# Patient Record
Sex: Male | Born: 1975 | Race: White | Hispanic: No | Marital: Married | Smoking: Never smoker
Health system: Southern US, Community
[De-identification: ages and names within clinical notes are randomized; demographics above are authoritative.]

## PROBLEM LIST (undated history)

## (undated) DIAGNOSIS — K08409 Partial loss of teeth, unspecified cause, unspecified class: Secondary | ICD-10-CM

---

## 1998-12-02 ENCOUNTER — Emergency Department (HOSPITAL_COMMUNITY): Admission: EM | Admit: 1998-12-02 | Discharge: 1998-12-02 | Payer: Self-pay

## 2017-09-21 ENCOUNTER — Other Ambulatory Visit: Payer: Self-pay | Admitting: Family Medicine

## 2017-09-22 ENCOUNTER — Other Ambulatory Visit: Payer: Self-pay | Admitting: Family Medicine

## 2017-09-22 DIAGNOSIS — N50812 Left testicular pain: Secondary | ICD-10-CM

## 2017-09-22 DIAGNOSIS — K4090 Unilateral inguinal hernia, without obstruction or gangrene, not specified as recurrent: Secondary | ICD-10-CM

## 2017-09-25 ENCOUNTER — Other Ambulatory Visit: Payer: Self-pay

## 2017-09-25 ENCOUNTER — Inpatient Hospital Stay
Admission: RE | Admit: 2017-09-25 | Discharge: 2017-09-25 | Disposition: A | Discharge status: Home / Self Care | Payer: Self-pay | Source: Ambulatory Visit | Attending: Family Medicine | Admitting: Family Medicine

## 2017-09-28 ENCOUNTER — Ambulatory Visit
Admission: RE | Admit: 2017-09-28 | Discharge: 2017-09-28 | Disposition: A | Discharge status: Home / Self Care | Payer: 59 | Source: Ambulatory Visit | Attending: Family Medicine | Admitting: Family Medicine

## 2017-09-28 DIAGNOSIS — K4090 Unilateral inguinal hernia, without obstruction or gangrene, not specified as recurrent: Secondary | ICD-10-CM

## 2017-09-28 DIAGNOSIS — N50812 Left testicular pain: Secondary | ICD-10-CM

## 2019-07-07 ENCOUNTER — Ambulatory Visit: Payer: Self-pay | Attending: Internal Medicine

## 2019-07-07 ENCOUNTER — Ambulatory Visit: Payer: Self-pay

## 2019-07-07 DIAGNOSIS — Z23 Encounter for immunization: Secondary | ICD-10-CM | POA: Insufficient documentation

## 2019-07-07 NOTE — Progress Notes (Signed)
   Covid-19 Vaccination Clinic  Name:  MATTI MINNEY    MRN: [id number] DOB: Mar 17, 1976  07/07/2019  Mr. Klingerman was observed post Covid-19 immunization for 15 minutes without incidence. He was provided with Vaccine Information Sheet and instruction to access the V-Safe system.   Mr. Dains was instructed to call 911 with any severe reactions post vaccine: Marland Kitchen Difficulty breathing  . Swelling of your face and throat  . A fast heartbeat  . A bad rash all over your body  . Dizziness and weakness    Immunizations Administered    Name Date Dose VIS Date Route   Pfizer COVID-19 Vaccine 07/07/2019  5:23 PM 0.3 mL 05/06/2019 Intramuscular   Manufacturer: [employer], [employer]   Lot: [id number]   NDC: [address]-7

## 2019-07-27 IMAGING — US US PELVIS LIMITED
1 series · 7 of 7 positions shown · non-contrast
Comparison: Scrotal ultrasound today reported separately.

CLINICAL DATA: 42-year-old male with left suprapubic fullness and
pulling sensation with tenderness intermittently for 2 months.

EXAM:
LIMITED ULTRASOUND OF PELVIS
TECHNIQUE: Limited ultrasound examination was performed in the area of clinical
concern.

[Series 1: us pelvis limited · 0.06mm/px · 7 acquisitions, 7 frames shown]
[im 1/7]
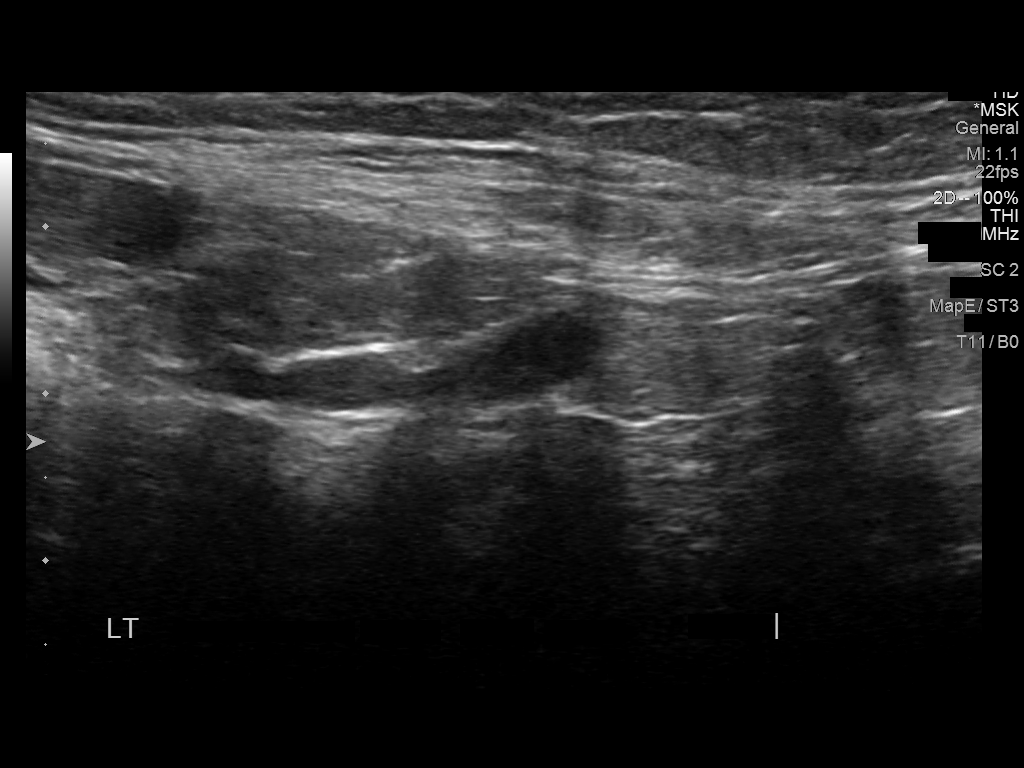
[im 2/7]
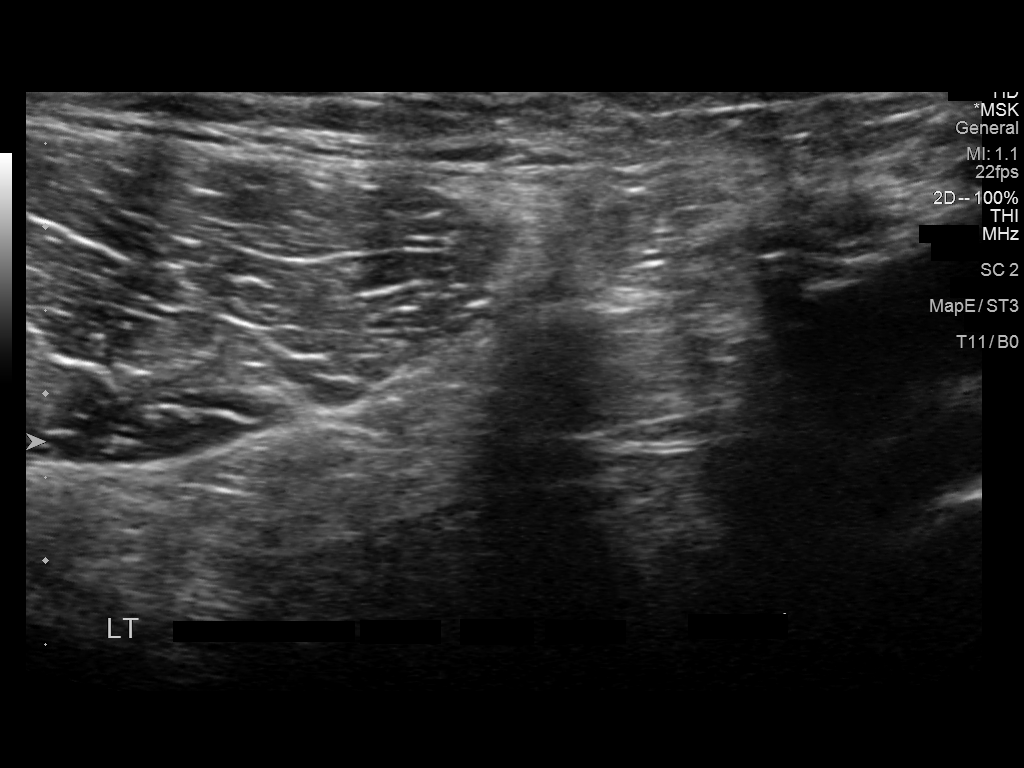
[im 3/7]
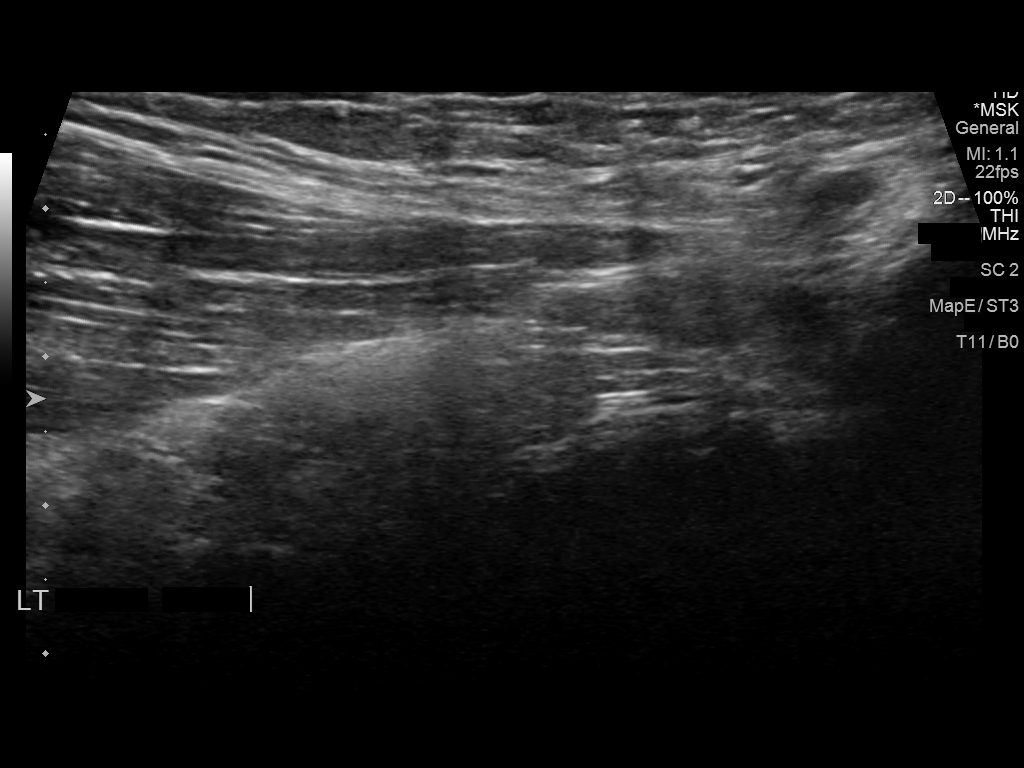
[im 4/7]
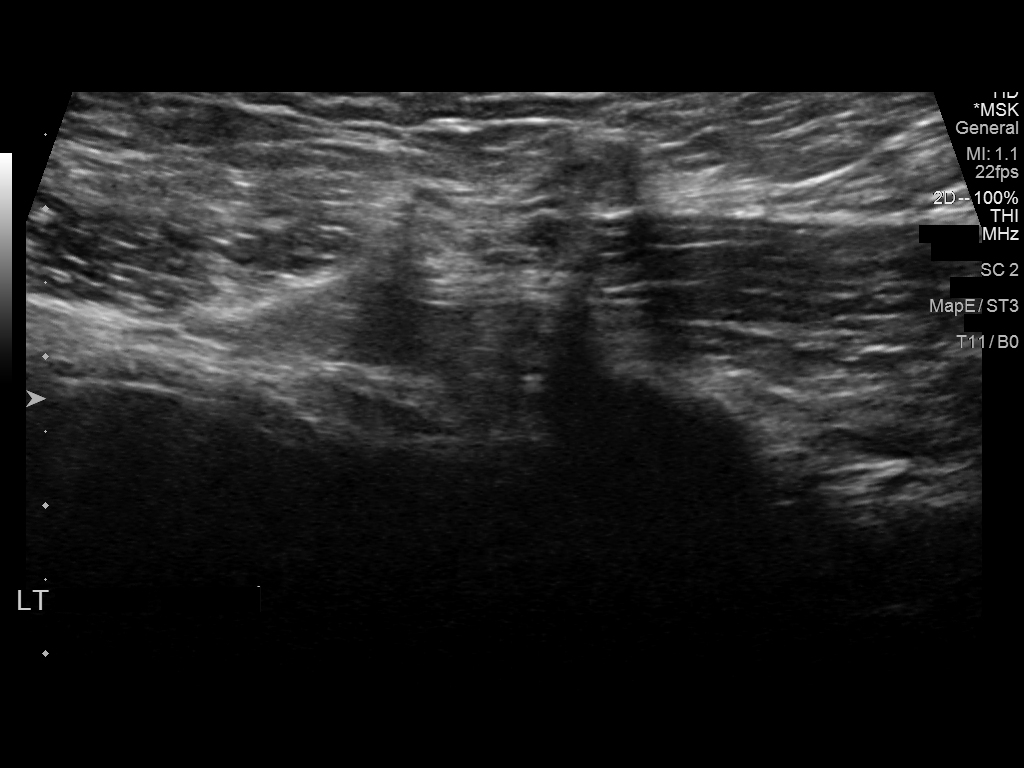
[im 5/7]
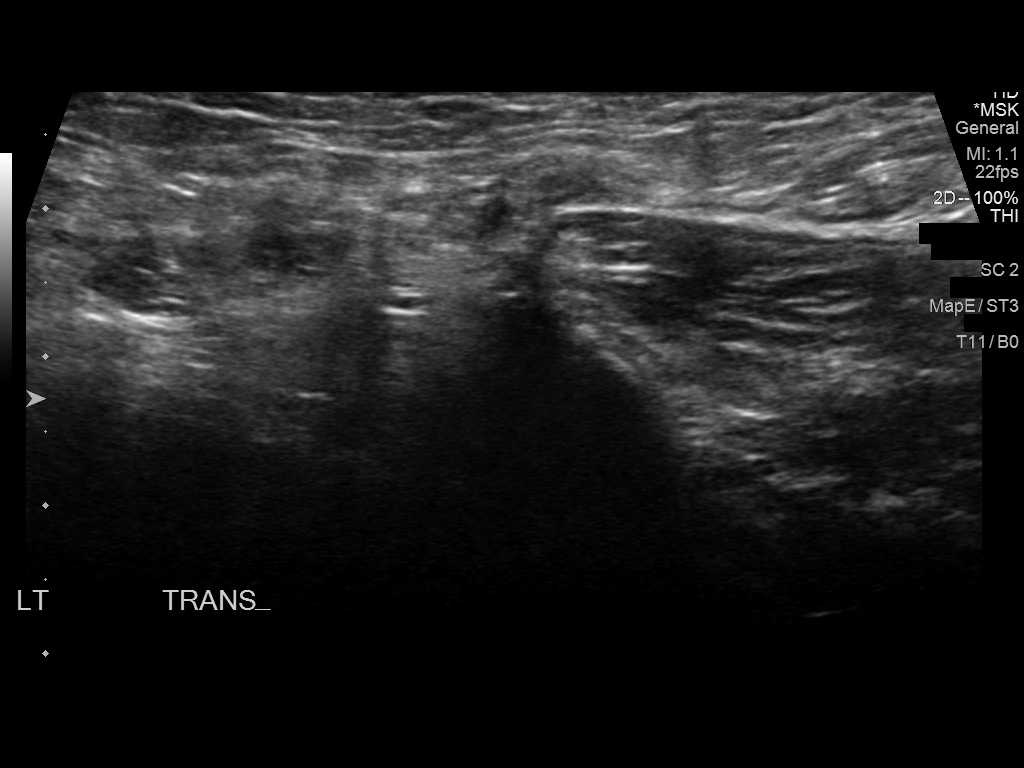
[im 6/7]
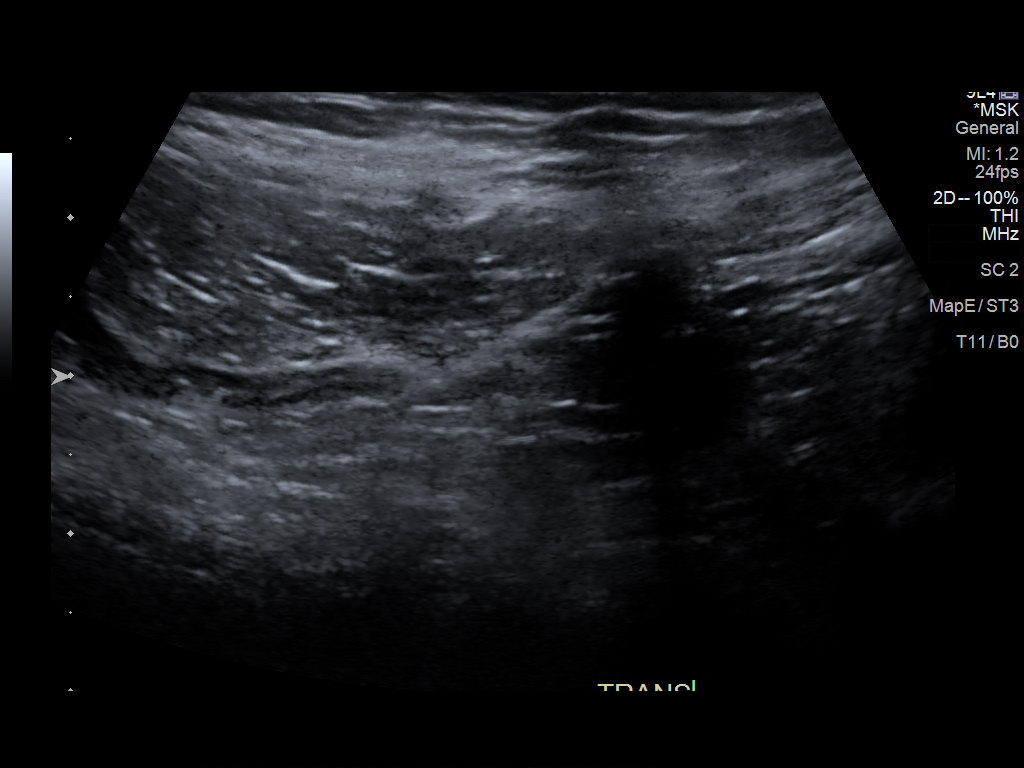
[im 7/7]
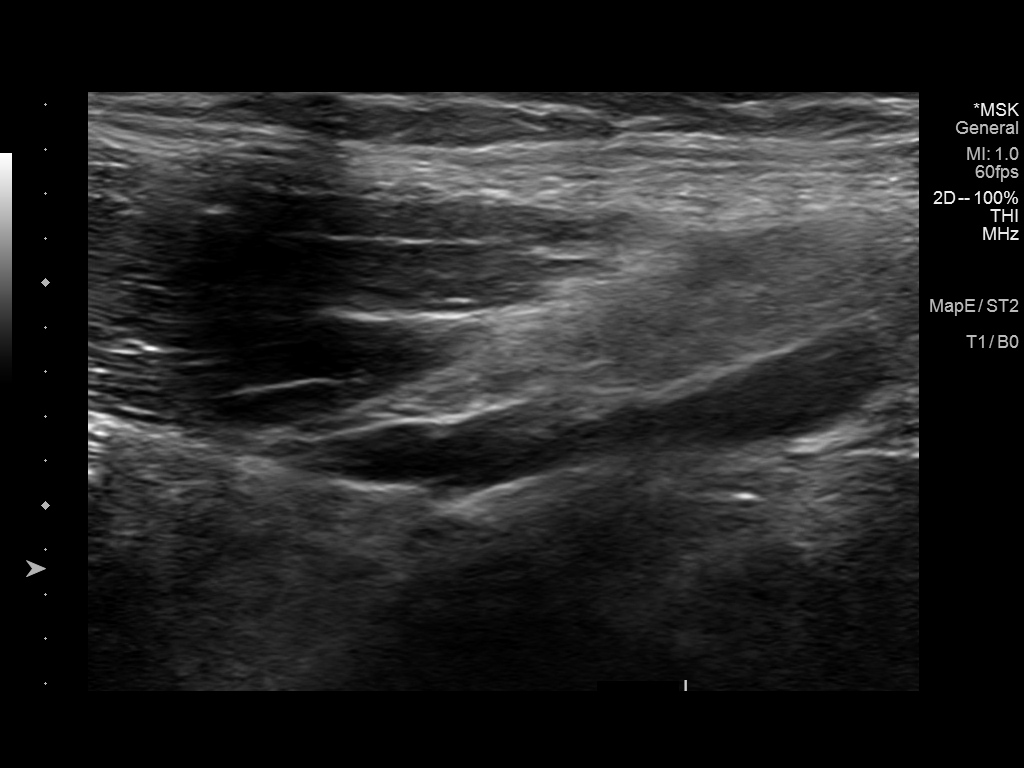

[7 of 7 positions shown; findings below may reference images not displayed]

FINDINGS: Grayscale imaging in the area of clinical concern demonstrates
normal appearing fibromuscular architecture in both resting and
Valsalva states. No hernia, fluid collection, or ultrasound
abnormality is identified.
IMPRESSION: No sonographic abnormality in the area of clinical concern.

If there is strong persistent suspicion of inguinal hernia then
Pelvis CT would be most sensitive.

## 2019-07-31 ENCOUNTER — Ambulatory Visit: Payer: 59 | Attending: Internal Medicine

## 2019-07-31 DIAGNOSIS — Z23 Encounter for immunization: Secondary | ICD-10-CM

## 2019-07-31 NOTE — Progress Notes (Signed)
   Covid-19 Vaccination Clinic  Name:  Bradley Hammond    MRN: [id number] DOB: 10/20/1975  07/31/2019  Mr. [name] was observed post Covid-19 immunization for 15 minutes without incident. He was provided with Vaccine Information Sheet and instruction to access the V-Safe system.   Mr. [name] was instructed to call 911 with any severe reactions post vaccine: Marland Kitchen Difficulty breathing  . Swelling of face and throat  . A fast heartbeat  . A bad rash all over body  . Dizziness and weakness   Immunizations Administered    Name Date Dose VIS Date Route   Pfizer COVID-19 Vaccine 07/31/2019 12:03 PM 0.3 mL 05/06/2019 Intramuscular   Manufacturer: [employer], [employer]   Lot: [id number]   NDC: [address]-6

## 2019-08-15 IMAGING — US US SCROTUM W/ DOPPLER COMPLETE
1 series · 13 of 25 positions shown · non-contrast
Comparison: Inguinal/limited pelvis ultrasound today reported
separately.

CLINICAL DATA: 42-year-old male with left suprapubic fullness and
pulling sensation with tenderness intermittently for 2 months.

EXAM:
SCROTAL ULTRASOUND
DOPPLER ULTRASOUND OF THE TESTICLES
TECHNIQUE: Complete ultrasound examination of the testicles, epididymis, and
other scrotal structures was performed. Color and spectral Doppler
ultrasound were also utilized to evaluate blood flow to the
testicles.

[Series 1: us scrotum w/ doppler complete · 0.08mm/px · 13 of 46 slices shown]
[im 1/46]
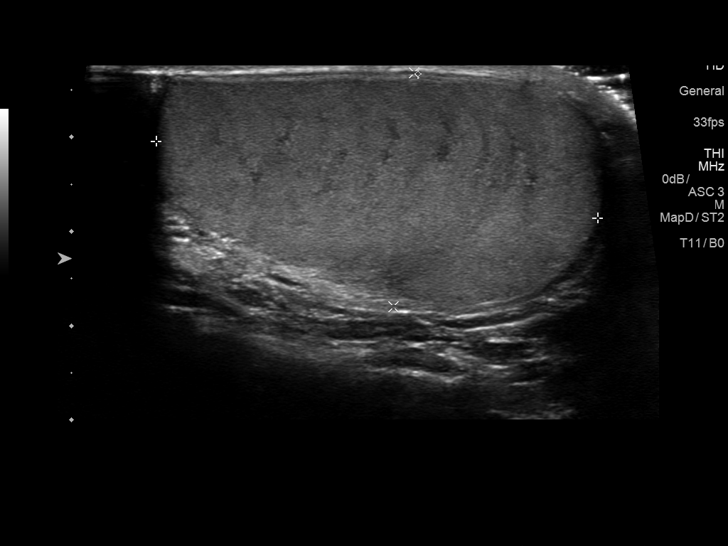
[im 4/46]
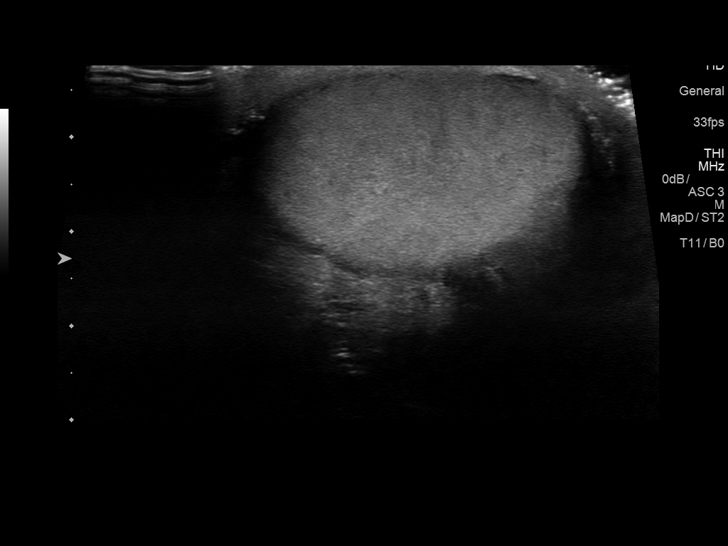
[im 8/46]
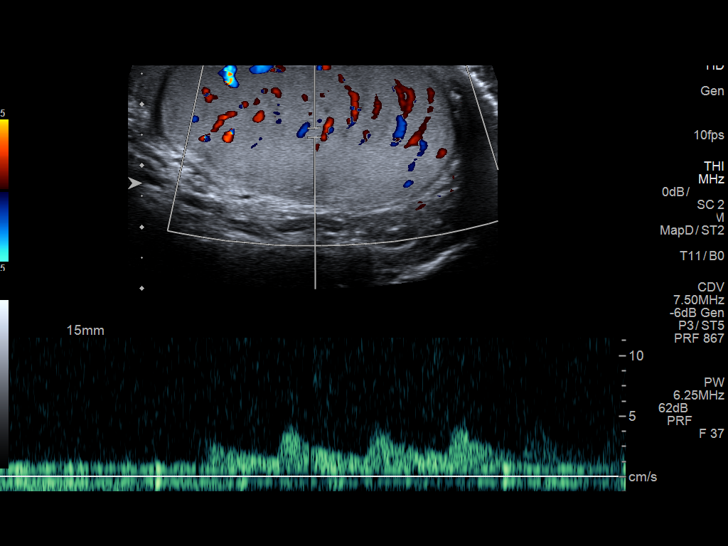
[im 12/46]
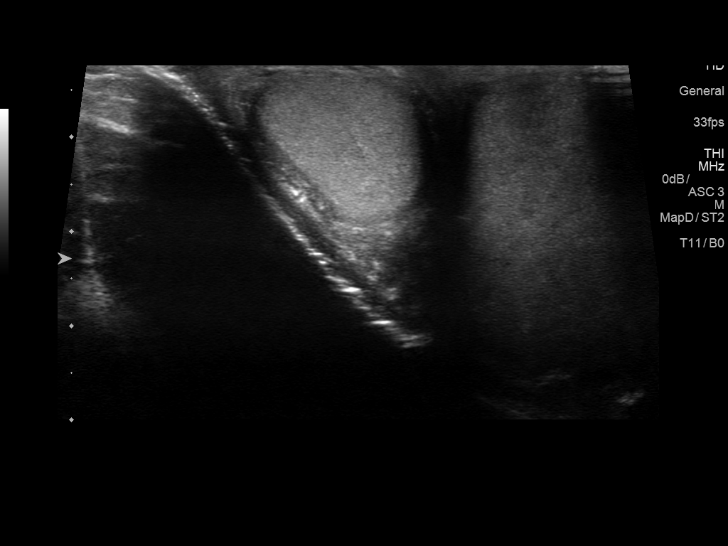
[im 16/46]
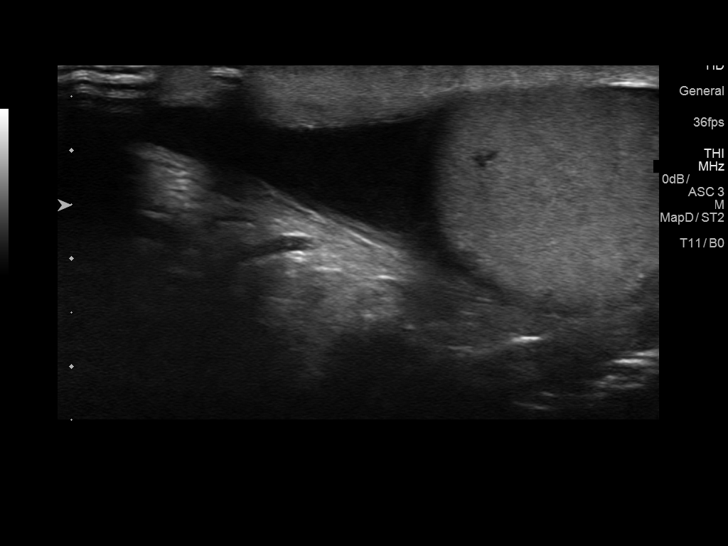
[im 19/46]
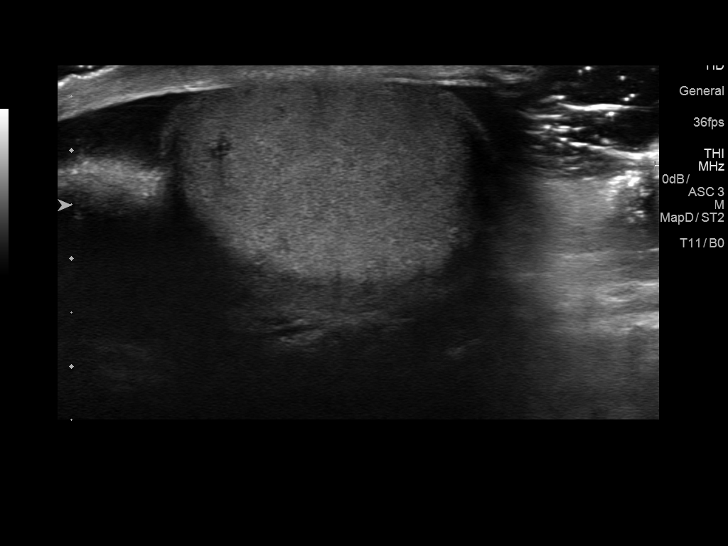
[im 23/46]
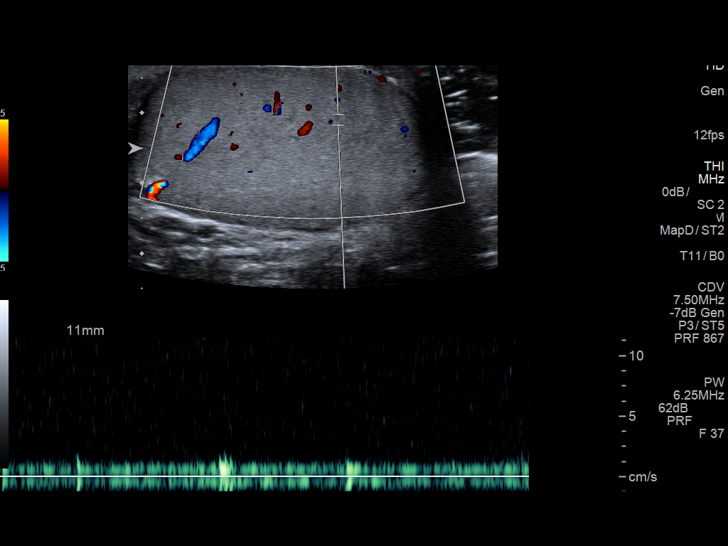
[im 27/46]
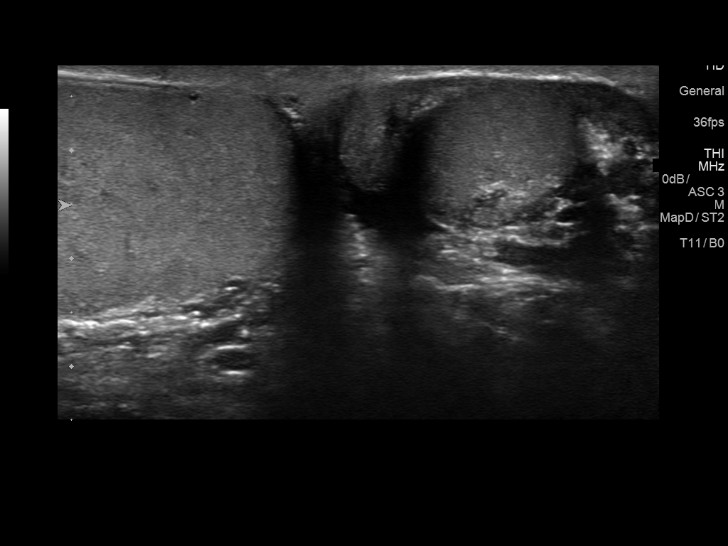
[im 31/46]
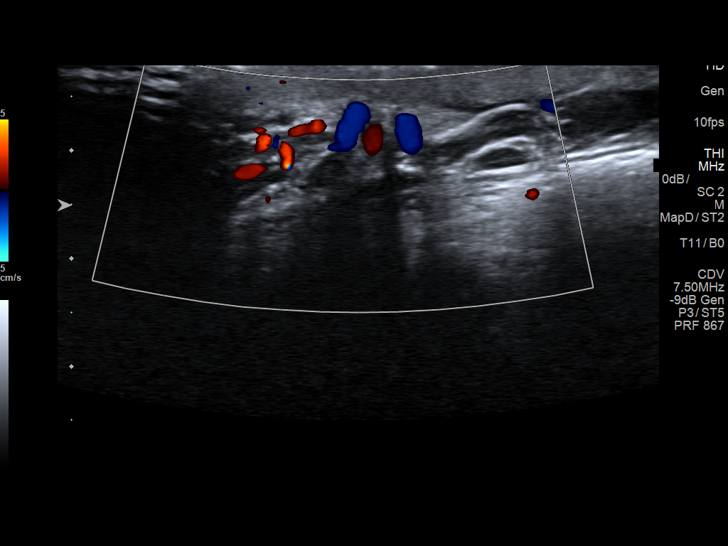
[im 34/46]
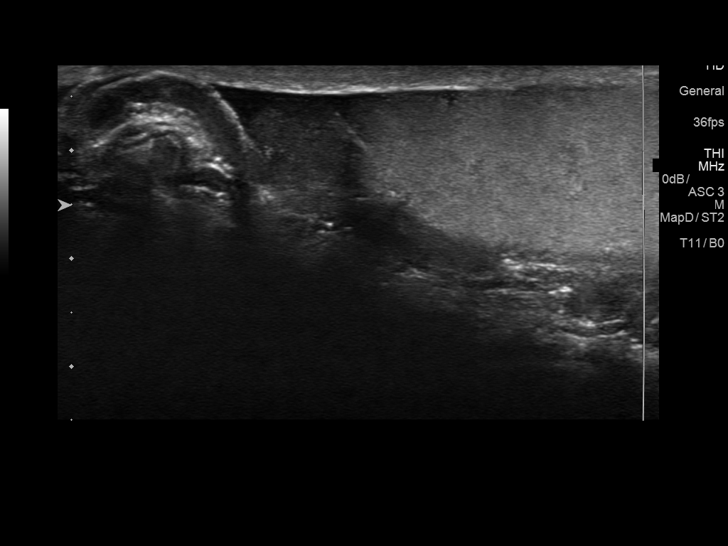
[im 38/46]
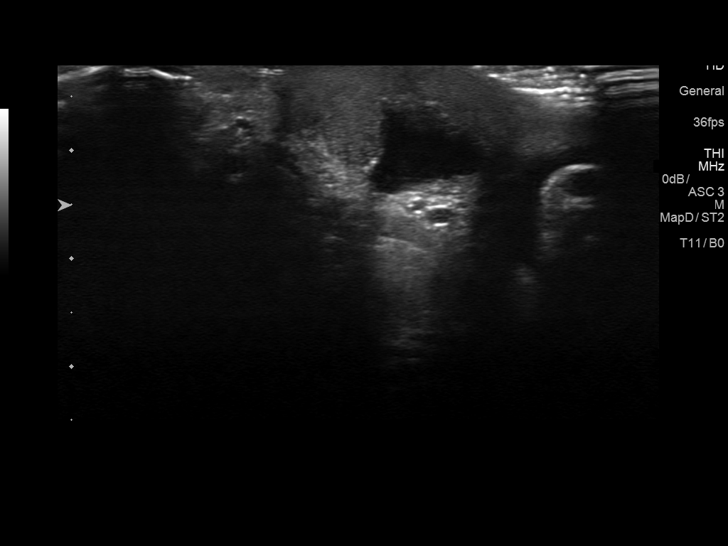
[im 42/46]
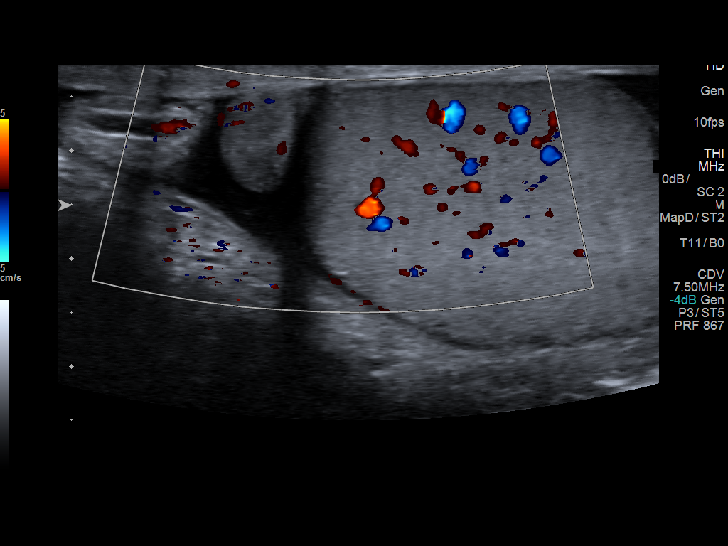
[im 46/46]
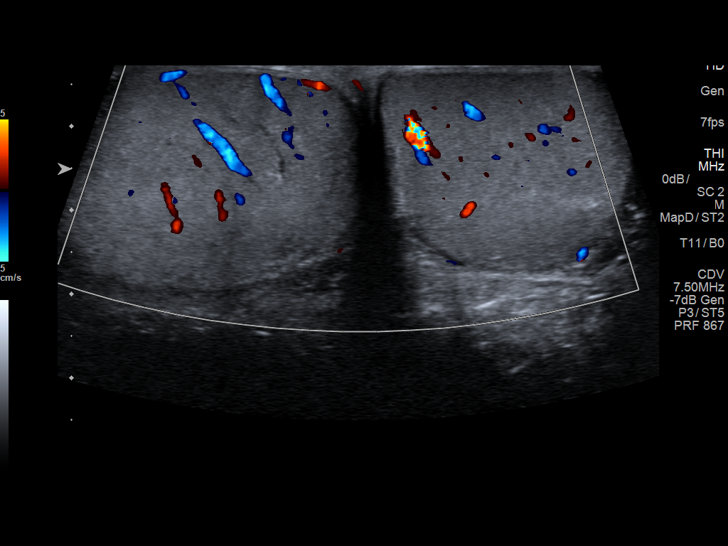

[13 of 25 positions shown; findings below may reference images not displayed]

FINDINGS: Right testicle

Measurements: 4.8 x 2.5 x 3.0 centimeters. No mass or microlithiasis
visualized.

Left testicle

Measurements: 4.2 x 2.5 x 3.0 centimeters. No mass or microlithiasis
visualized.

Right epididymis:  Normal in size and appearance.

Left epididymis:  Normal in size and appearance.

Hydrocele: Small volume of simple appearing fluid in the left hemi
scrotum compatible with hydrocele (images 20 and 21). No
contralateral right scrotal fluid.

Varicocele:  None visualized.

Pulsed Doppler interrogation of both testes demonstrates normal low
resistance arterial and venous waveforms bilaterally.
IMPRESSION: Negative aside from a small left-side Hydrocele. Negative for
testicular mass or torsion.

## 2020-08-20 ENCOUNTER — Emergency Department (HOSPITAL_BASED_OUTPATIENT_CLINIC_OR_DEPARTMENT_OTHER)
Admission: EM | Admit: 2020-08-20 | Discharge: 2020-08-20 | Disposition: A | Discharge status: Home / Self Care | Payer: 59 | Attending: Emergency Medicine | Admitting: Emergency Medicine

## 2020-08-20 ENCOUNTER — Other Ambulatory Visit: Payer: Self-pay

## 2020-08-20 DIAGNOSIS — H5789 Other specified disorders of eye and adnexa: Secondary | ICD-10-CM | POA: Diagnosis present

## 2020-08-20 DIAGNOSIS — H1031 Unspecified acute conjunctivitis, right eye: Secondary | ICD-10-CM | POA: Diagnosis not present

## 2020-08-20 MED ORDER — TETRACAINE HCL 0.5 % OP SOLN
2.0000 [drp] | Freq: Once | OPHTHALMIC | Status: AC
  Administered 2020-08-20: 2 [drp] via OPHTHALMIC
  Filled 2020-08-20: qty 4

## 2020-08-20 MED ORDER — KETOROLAC TROMETHAMINE 0.5 % OP SOLN: 1.0000 [drp] | Freq: Four times a day (QID) | OPHTHALMIC | Status: DC

## 2020-08-20 MED ORDER — MOXIFLOXACIN HCL 0.5 % OP SOLN: OPHTHALMIC | 0 refills | Status: DC

## 2020-08-20 MED ORDER — FLUORESCEIN SODIUM 1 MG OP STRP
1.0000 | ORAL_STRIP | Freq: Once | OPHTHALMIC | Status: AC
  Administered 2020-08-20: 1 via OPHTHALMIC
  Filled 2020-08-20: qty 1

## 2020-08-20 MED ORDER — MOXIFLOXACIN HCL 0.5 % OP SOLN: 2.0000 [drp] | Freq: Once | OPHTHALMIC | Status: DC

## 2020-08-20 MED ORDER — KETOROLAC TROMETHAMINE 0.5 % OP SOLN: 1.0000 [drp] | Freq: Four times a day (QID) | OPHTHALMIC | 0 refills | Status: DC

## 2020-08-20 NOTE — Discharge Instructions (Addendum)
Refrain from using contacts for the week.   Moxifloxacin 0.5% solution 2 drops every 2 hours for 2 days THEN q6hrs for 5 days.  Ketorolac 0.4% 1 drop q6hr x 2-3d  If irritation does not improve in 3-5 days, please make an appointment with your eye doctor.

## 2020-08-20 NOTE — ED Triage Notes (Signed)
Pt is present to the ED after he removed his contacts at appx. 2100 last night and after that his right eye became painful and irritated. Pt states, "It feels like there is sand or something in my eye." Pt attempted to flush eye with saline at home with no relief of sx.

## 2020-08-20 NOTE — ED Provider Notes (Signed)
[facility] EMERGENCY DEPT Provider Note  CSN: [id number] Arrival date & time: 08/20/20 0301  Chief Complaint(s) Eye Problem  HPI Bradley Hammond is a 45 y.o. male    Eye Problem Location:  Right eye Quality:  Aching, foreign body sensation and stinging Severity:  Moderate Onset quality:  Gradual Duration: 6-9 hours. Timing:  Constant Progression:  Worsening Chronicity:  New Context: contact lenses   Relieved by:  Nothing Worsened by:  Eye movement and contact Ineffective treatments:  Commercial eye wash Associated symptoms: redness and tearing   Associated symptoms: no blurred vision, no decreased vision, no discharge, no double vision, no headaches, no nausea and no photophobia     Past Medical History No past medical history on file. There are no problems to display for this patient.  Home Medication(s) Prior to Admission medications   Not on File                                                                                                                                    Past Surgical History ** The histories are not reviewed yet. Please review them in the "History" navigator section and refresh this SmartLink. Family History No family history on file.  Social History   Allergies Patient has no allergy information on record.  Review of Systems Review of Systems  Eyes: Positive for redness. Negative for blurred vision, double vision, photophobia and discharge.  Gastrointestinal: Negative for nausea.  Neurological: Negative for headaches.   All other systems are reviewed and are negative for acute change except as noted in the HPI  Physical Exam Vital Signs  I have reviewed the triage vital signs BP (!) 162/81   Pulse (!) 51   Temp 97.6 F (36.4 C) (Oral)   Resp 18   SpO2 100%   Physical Exam Vitals reviewed.  Constitutional:      General: He is not in acute distress.    Appearance: He is well-developed. He is not diaphoretic.   HENT:     Head: Normocephalic and atraumatic.     Jaw: No trismus.     Right Ear: External ear normal.     Left Ear: External ear normal.     Nose: Nose normal.  Eyes:     General: Lids are normal. Lids are everted, no foreign bodies appreciated. Vision grossly intact. No scleral icterus.       Right eye: No foreign body, discharge or hordeolum.        Left eye: No foreign body, discharge or hordeolum.     Intraocular pressure: Right eye pressure is 21 mmHg. Left eye pressure is 20 mmHg. Measurements were taken using an automated tonometer.    Extraocular Movements: Extraocular movements intact.     Conjunctiva/sclera:     Right eye: Right conjunctiva is injected. No chemosis, exudate or hemorrhage.    Left eye: Left conjunctiva is not injected.  Pupils: Pupils are equal, round, and reactive to light.  Neck:     Trachea: Phonation normal.  Cardiovascular:     Rate and Rhythm: Normal rate and regular rhythm.  Pulmonary:     Effort: Pulmonary effort is normal. No respiratory distress.     Breath sounds: No stridor.  Abdominal:     General: There is no distension.  Musculoskeletal:        General: Normal range of motion.     Cervical back: Normal range of motion.  Neurological:     Mental Status: He is alert and oriented to person, place, and time.  Psychiatric:        Behavior: Behavior normal.     ED Results and Treatments Labs (all labs ordered are listed, but only abnormal results are displayed) Labs Reviewed - No data to display                                                                                                                       EKG  EKG Interpretation  Date/Time:    Ventricular Rate:    PR Interval:    QRS Duration:   QT Interval:    QTC Calculation:   R Axis:     Text Interpretation:        Radiology No results found.  Pertinent labs & imaging results that were available during my care of the patient were reviewed by me and considered  in my medical decision making (see chart for details).  Medications Ordered in ED Medications  ketorolac (ACULAR) 0.5 % ophthalmic solution 1 drop (has no administration in time range)  moxifloxacin (VIGAMOX) 0.5 % ophthalmic solution 2 drop (has no administration in time range)  tetracaine (PONTOCAINE) 0.5 % ophthalmic solution 2 drop (2 drops Right Eye Given 08/20/20 0323)  fluorescein ophthalmic strip 1 strip (1 strip Right Eye Given 08/20/20 0324)                                                                                                                                    Procedures Procedures  (including critical care time)  Medical Decision Making / ED Course I have reviewed the nursing notes for this encounter and the patient's prior records (if available in EHR or on provided paperwork).   Bradley Hammond was evaluated in Emergency Department on 08/20/2020 for the symptoms described in the history of present illness.  He was evaluated in the context of the global COVID-19 pandemic, which necessitated consideration that the patient might be at risk for infection with the SARS-CoV-2 virus that causes COVID-19. Institutional protocols and algorithms that pertain to the evaluation of patients at risk for COVID-19 are in a state of rapid change based on information released by regulatory bodies including the CDC and federal and state organizations. These policies and algorithms were followed during the patient's care in the ED.  Conjunctivitis. No obvious FB noted. No abrasion or uptake. No chemosis or stye. No sign of cellulitis. Not consistent with glaucoma. Ketorolac and Moxi drops. No contacts for 1 week. Ophtho f/u if no improvement.      Final Clinical Impression(s) / ED Diagnoses Final diagnoses:  Acute conjunctivitis of right eye, unspecified acute conjunctivitis type    The patient appears reasonably screened and/or stabilized for discharge and I doubt any other medical  condition or other [facility] requiring further screening, evaluation, or treatment in the ED at this time prior to discharge. Safe for discharge with strict return precautions.  Disposition: Discharge  Condition: Good  I have discussed the results, Dx and Tx plan with the patient/family who expressed understanding and agree(s) with the plan. Discharge instructions discussed at length. The patient/family was given strict return precautions who verbalized understanding of the instructions. No further questions at time of discharge.    ED Discharge Orders    None       Follow Up: Eye doctor  Call  if symptoms do not improve or  worsen, in 3-5 days     This chart was dictated using voice recognition software.  Despite best efforts to proofread,  errors can occur which can change the documentation meaning.   Nira Conn, MD 08/20/20 [phone]

## 2021-07-22 ENCOUNTER — Encounter (HOSPITAL_BASED_OUTPATIENT_CLINIC_OR_DEPARTMENT_OTHER): Payer: Self-pay | Admitting: Obstetrics and Gynecology

## 2021-07-22 ENCOUNTER — Emergency Department (HOSPITAL_BASED_OUTPATIENT_CLINIC_OR_DEPARTMENT_OTHER): Payer: BC Managed Care – PPO | Admitting: Radiology

## 2021-07-22 ENCOUNTER — Emergency Department (HOSPITAL_BASED_OUTPATIENT_CLINIC_OR_DEPARTMENT_OTHER)
Admission: EM | Admit: 2021-07-22 | Discharge: 2021-07-22 | Disposition: A | Discharge status: Home / Self Care | Payer: BC Managed Care – PPO | Attending: Student | Admitting: Student

## 2021-07-22 ENCOUNTER — Other Ambulatory Visit: Payer: Self-pay

## 2021-07-22 DIAGNOSIS — R0602 Shortness of breath: Secondary | ICD-10-CM | POA: Diagnosis not present

## 2021-07-22 DIAGNOSIS — R748 Abnormal levels of other serum enzymes: Secondary | ICD-10-CM

## 2021-07-22 DIAGNOSIS — R5383 Other fatigue: Secondary | ICD-10-CM | POA: Diagnosis not present

## 2021-07-22 DIAGNOSIS — R0600 Dyspnea, unspecified: Secondary | ICD-10-CM

## 2021-07-22 HISTORY — DX: Partial loss of teeth, unspecified cause, unspecified class: K08.409

## 2021-07-22 LAB — TROPONIN I (HIGH SENSITIVITY): Troponin I (High Sensitivity): 11 ng/L (ref <18)

## 2021-07-22 LAB — CBC
HCT: 42.8 % (ref 39.0–52.0)
Hemoglobin: 14.5 g/dL (ref 13.0–17.0)
MCH: 31.3 pg (ref 26.0–34.0)
MCHC: 33.9 g/dL (ref 30.0–36.0)
MCV: 92.4 fL (ref 80.0–100.0)
Platelets: 240 10*3/uL (ref 150–400)
RBC: 4.63 MIL/uL (ref 4.22–5.81)
RDW: 12.4 % (ref 11.5–15.5)
WBC: 6.4 10*3/uL (ref 4.0–10.5)
nRBC: 0 % (ref 0.0–0.2)

## 2021-07-22 LAB — BASIC METABOLIC PANEL
Anion gap: 10 (ref 5–15)
BUN: 20 mg/dL (ref 6–20)
CO2: 26 mmol/L (ref 22–32)
Calcium: 9.6 mg/dL (ref 8.9–10.3)
Chloride: 101 mmol/L (ref 98–111)
Creatinine, Ser: 1.01 mg/dL (ref 0.61–1.24)
GFR, Estimated: >60 mL/min (ref >60)
Glucose, Bld: 98 mg/dL (ref 70–99)
Potassium: 4.1 mmol/L (ref 3.5–5.1)
Sodium: 137 mmol/L (ref 135–145)

## 2021-07-22 LAB — CK: Total CK: 814 U/L — ABNORMAL HIGH (ref 49–397)

## 2021-07-22 MED ORDER — LACTATED RINGERS IV BOLUS
1000.0000 mL | Freq: Once | INTRAVENOUS | Status: AC
  Administered 2021-07-22: 1000 mL via INTRAVENOUS

## 2021-07-22 NOTE — ED Triage Notes (Signed)
Patient reports to the ER for [facility] with an alert on his watch that told him his HR was in the 80's. Patient reports he ran 7 miles yesterday for a race and has had severe fatigue since. Patient reports he is supposed to go out of the country in the next week and wanted to get checked out before traveling to [place].

## 2021-07-22 NOTE — ED Provider Notes (Signed)
[facility] EMERGENCY DEPT Provider Note  CSN: [id number] Arrival date & time: 07/22/21 0906  Chief Complaint(s) Shortness of Breath  HPI Bradley Hammond is a 46 y.o. male who presents emergency department for evaluation of exertional shortness of breath.  Patient states that for the last 4 weeks he has noticed decreased exercise tolerance and over the last 24 hours feels slightly short of breath than usual.  He states that his primary concern is that his running buddy had similar symptoms and was found to have acute coronary syndrome.  He presents to ensure that he does not have something similar as he has a trip planned to [place] soon.  Of note, the patient did run a 7 mile relay marathon yesterday and he is feeling more fatigued today than usual.  He currently denies chest pain, shortness of breath at rest, abdominal pain, nausea, vomiting, swelling, urine discoloration or other systemic symptoms.   Shortness of Breath  Past Medical History Past Medical History:  Diagnosis Date   Wisdom teeth extracted    4 extracted   There are no problems to display for this patient.  Home Medication(s) Prior to Admission medications   Medication Sig Start Date End Date Taking? Authorizing Provider  ketorolac (ACULAR) 0.5 % ophthalmic solution Place 1 drop into the right eye every 6 (six) hours. 08/20/20   Nira Conn, MD  moxifloxacin (VIGAMOX) 0.5 % ophthalmic solution 2 drops every 2 hours for 2 days THEN q6hrs for 5 days 08/20/20   Cardama, Amadeo Garnet, MD                                                                                                                                    Past Surgical History History reviewed. No pertinent surgical history. Family History Family History  Problem Relation Age of Onset   Healthy Mother    Diabetes Father    Healthy Sister    Healthy Brother     Social History Social History   Tobacco Use   Smoking status:  Never    Passive exposure: Never   Smokeless tobacco: Never  Vaping Use   Vaping Use: Never used  Substance Use Topics   Alcohol use: Yes    Comment: 20 standard drinks weekly   Drug use: Never   Allergies Patient has no allergy information on record.  Review of Systems Review of Systems  Respiratory:  Positive for shortness of breath.    Physical Exam Vital Signs  I have reviewed the triage vital signs BP (!) 144/92    Pulse 67    Temp 98.5 F (36.9 C) (Oral)    Resp 14    Ht 6' (1.829 m)    Wt 88.5 kg    SpO2 100%    BMI 26.45 kg/m   Physical Exam Vitals and nursing note reviewed.  Constitutional:      General: He is not  in acute distress.    Appearance: He is well-developed.  HENT:     Head: Normocephalic and atraumatic.  Eyes:     Conjunctiva/sclera: Conjunctivae normal.  Cardiovascular:     Rate and Rhythm: Normal rate and regular rhythm.     Heart sounds: No murmur heard. Pulmonary:     Effort: Pulmonary effort is normal. No respiratory distress.     Breath sounds: Normal breath sounds.  Abdominal:     Palpations: Abdomen is soft.     Tenderness: There is no abdominal tenderness.  Musculoskeletal:        General: No swelling.     Cervical back: Neck supple.  Skin:    General: Skin is warm and dry.     Capillary Refill: Capillary refill takes less than 2 seconds.  Neurological:     Mental Status: He is alert.  Psychiatric:        Mood and Affect: Mood normal.    ED Results and Treatments Labs (all labs ordered are listed, but only abnormal results are displayed) Labs Reviewed  CBC  BASIC METABOLIC PANEL  CK  TROPONIN I (HIGH SENSITIVITY)                                                                                                                          Radiology No results found.  Pertinent labs & imaging results that were available during my care of the patient were reviewed by me and considered in my medical decision making (see MDM for  details).  Medications Ordered in ED Medications  lactated ringers bolus 1,000 mL (1,000 mLs Intravenous New Bag/Given 07/22/21 0933)                                                                                                                                     Procedures Procedures  (including critical care time)  Medical Decision Making / ED Course   This patient presents to the ED for concern of exertional shortness of breath, this involves an extensive number of treatment options, and is a complaint that carries with it a high risk of complications and morbidity.  The differential diagnosis includes reactive airway disease, exercise-induced asthma, anemia, ACS, PE, rhabdo  MDM: Patient seen emergency department for evaluation of decreased exercise tolerance and shortness of breath.  Physical exam is unremarkable.  Laboratory evaluation including high-sensitivity troponin largely unremarkable outside  of an elevated CK to 814.  This CK elevation is to be expected as patient had significant exercise yesterday and with a normal creatinine, I have very low suspicion for developing rhabdo.  Regardless, the patient was given 1 L lactated Ringer's for suspected dehydration.  On reevaluation patient remains saturating 100% on room air with no significant tachycardia here in the ER.  He is safe for outpatient follow-up.  Patient does not have a primary care physician and thus resources were provided to establish primary care.  Patient has no chest pain and I have low suspicion for ACS.  Patient then discharged.   Additional history obtained:  -External records from outside source obtained and reviewed including: Chart review including previous notes, labs, imaging, consultation notes   Lab Tests: -I ordered, reviewed, and interpreted labs.   The pertinent results include:   Labs Reviewed  CBC  BASIC METABOLIC PANEL  CK  TROPONIN I (HIGH SENSITIVITY)      EKG   EKG  Interpretation  Date/Time:  Monday July 22 2021 09:12:59 EST Ventricular Rate:  73 PR Interval:  188 QRS Duration: 94 QT Interval:  406 QTC Calculation: 448 R Axis:   43 Text Interpretation: Sinus rhythm Confirmed by Jamee Keach ([id number]) on 07/22/2021 9:42:15 AM         Imaging Studies ordered: I ordered imaging studies including CXR I independently visualized and interpreted imaging. I agree with the radiologist interpretation   Medicines ordered and prescription drug management: Meds ordered this encounter  Medications   lactated ringers bolus 1,000 mL    -I have reviewed the patients home medicines and have made adjustments as needed  Critical interventions none  Cardiac Monitoring: The patient was maintained on a cardiac monitor.  I personally viewed and interpreted the cardiac monitored which showed an underlying rhythm of: NSR  Social Determinants of Health:  Factors impacting patients care include: none   Reevaluation: After the interventions noted above, I reevaluated the patient and found that they have :improved  Co morbidities that complicate the patient evaluation  Past Medical History:  Diagnosis Date   Wisdom teeth extracted    4 extracted      Dispostion: I considered admission for this patient, but with negative laboratory work-up, no persistent symptoms here in the emergency department he is safe for outpatient follow-up.  CK elevation is mild and explainable from his recent strenuous exercise.     Final Clinical Impression(s) / ED Diagnoses Final diagnoses:  None     @PCDICTATION @    Glendora Score, MD 07/22/21 1047

## 2021-08-18 ENCOUNTER — Other Ambulatory Visit: Payer: Self-pay

## 2021-08-18 ENCOUNTER — Encounter (HOSPITAL_BASED_OUTPATIENT_CLINIC_OR_DEPARTMENT_OTHER): Payer: Self-pay | Admitting: Emergency Medicine

## 2021-08-18 ENCOUNTER — Emergency Department (HOSPITAL_BASED_OUTPATIENT_CLINIC_OR_DEPARTMENT_OTHER)
Admission: EM | Admit: 2021-08-18 | Discharge: 2021-08-18 | Disposition: A | Discharge status: Home / Self Care | Payer: BC Managed Care – PPO | Attending: Emergency Medicine | Admitting: Emergency Medicine

## 2021-08-18 DIAGNOSIS — R42 Dizziness and giddiness: Secondary | ICD-10-CM | POA: Diagnosis present

## 2021-08-18 DIAGNOSIS — T675XXA Heat exhaustion, unspecified, initial encounter: Secondary | ICD-10-CM | POA: Insufficient documentation

## 2021-08-18 LAB — CBG MONITORING, ED: Glucose-Capillary: 81 mg/dL (ref 70–99)

## 2021-08-18 LAB — CBC
HCT: 44.8 % (ref 39.0–52.0)
Hemoglobin: 14.9 g/dL (ref 13.0–17.0)
MCH: 31.2 pg (ref 26.0–34.0)
MCHC: 33.3 g/dL (ref 30.0–36.0)
MCV: 93.7 fL (ref 80.0–100.0)
Platelets: 212 10*3/uL (ref 150–400)
RBC: 4.78 MIL/uL (ref 4.22–5.81)
RDW: 12.7 % (ref 11.5–15.5)
WBC: 4.2 10*3/uL (ref 4.0–10.5)
nRBC: 0 % (ref 0.0–0.2)

## 2021-08-18 LAB — URINALYSIS, ROUTINE W REFLEX MICROSCOPIC
Bilirubin Urine: NEGATIVE
Glucose, UA: NEGATIVE mg/dL
Hgb urine dipstick: NEGATIVE
Ketones, ur: NEGATIVE mg/dL
Leukocytes,Ua: NEGATIVE
Nitrite: NEGATIVE
Protein, ur: NEGATIVE mg/dL
Specific Gravity, Urine: 1.017 (ref 1.005–1.030)
pH: 5.5 (ref 5.0–8.0)

## 2021-08-18 LAB — BASIC METABOLIC PANEL
Anion gap: 7 (ref 5–15)
BUN: 13 mg/dL (ref 6–20)
CO2: 30 mmol/L (ref 22–32)
Calcium: 9.6 mg/dL (ref 8.9–10.3)
Chloride: 102 mmol/L (ref 98–111)
Creatinine, Ser: 1.3 mg/dL — ABNORMAL HIGH (ref 0.61–1.24)
GFR, Estimated: >60 mL/min (ref >60)
Glucose, Bld: 100 mg/dL — ABNORMAL HIGH (ref 70–99)
Potassium: 4.1 mmol/L (ref 3.5–5.1)
Sodium: 139 mmol/L (ref 135–145)

## 2021-08-18 NOTE — ED Provider Notes (Signed)
? ?[facility] EM[facility]Y DEPT  ?Provider Note ? ?CSN: [id number] ?Arrival date & time: 08/18/21 1643 ? ?History ?Chief Complaint  ?Patient presents with  ? Dizziness  ? ? ?Bradley Hammond is a 46 y.o. male with no known medical problems reports he was participating in a relay race in [place] 2 days ago. He reports it was ~63F and he was sprinting towards the end of his first 7 mile leg when he became weak and disoriented. He was assisted to the finish line and given fluids and ice. Eventually transported to a local hospital but left before being seen. He reports a period of about an hour where he does not remember what happened. He was still feeling a little foggy this morning and so he decided to come for evaluation this afternoon. He did not have any chest pains. He does not think he was unconscious for any period of time. He is feeling better now. No muscle soreness or tea colored urine.  ? ? ?Home Medications ?Prior to Admission medications   ?Medication Sig Start Date End Date Taking? Authorizing Provider  ?ketorolac (ACULAR) 0.5 % ophthalmic solution Place 1 drop into the right eye every 6 (six) hours. 08/20/20   Nira Conn, MD  ?moxifloxacin (VIGAMOX) 0.5 % ophthalmic solution 2 drops every 2 hours for 2 days THEN q6hrs for 5 days 08/20/20   Cardama, Amadeo Garnet, MD  ? ? ? ?Allergies    ?Patient has no known allergies. ? ? ?Review of Systems   ?Review of Systems ?Please see HPI for pertinent positives and negatives ? ?Physical Exam ?BP 125/78   Pulse (!) 42   Temp 98.8 ?F (37.1 ?C)   Resp 11   Ht 6' (1.829 m)   Wt 88.5 kg   SpO2 98%   BMI 26.45 kg/m?  ? ?Physical Exam ?Vitals and nursing note reviewed.  ?Constitutional:   ?   Appearance: Normal appearance.  ?HENT:  ?   Head: Normocephalic and atraumatic.  ?   Nose: Nose normal.  ?   Mouth/Throat:  ?   Mouth: Mucous membranes are moist.  ?Eyes:  ?   Extraocular Movements: Extraocular movements intact.  ?   Conjunctiva/sclera:  Conjunctivae normal.  ?Cardiovascular:  ?   Rate and Rhythm: Normal rate.  ?Pulmonary:  ?   Effort: Pulmonary effort is normal.  ?   Breath sounds: Normal breath sounds.  ?Abdominal:  ?   General: Abdomen is flat.  ?   Palpations: Abdomen is soft.  ?   Tenderness: There is no abdominal tenderness.  ?Musculoskeletal:     ?   General: No swelling. Normal range of motion.  ?   Cervical back: Neck supple.  ?Skin: ?   General: Skin is warm and dry.  ?Neurological:  ?   General: No focal deficit present.  ?   Mental Status: He is alert.  ?Psychiatric:     ?   Mood and Affect: Mood normal.  ? ? ?ED Results / Procedures / Treatments   ?EKG ?EKG Interpretation ? ?Date/Time:  Sunday August 18 2021 17:41:57 EDT ?Ventricular Rate:  61 ?PR Interval:  190 ?QRS Duration: 88 ?QT Interval:  430 ?QTC Calculation: 432 ?R Axis:   60 ?Text Interpretation: Normal sinus rhythm Normal ECG When compared with ECG of 22-Jul-2021 09:12, PREVIOUS ECG IS PRESENT Confirmed by Edwin Dada ([id number]) on 08/18/2021 8:48:57 PM ? ?Procedures ?Procedures ? ?Medications Ordered in the ED ?Medications - No data to display ? ?  Initial Impression and Plan ? Patient with what sounds like a heat injury 2 days ago is feeling better now. He has a normal EKG, labs done in triage show normal CBC, urine is clear. BMP with mild increase in Cr from prior baseline. Doubt there is any significant rhabdo at this point. He was seen for a similar episode after running a race last month. He reports that he does not usually exert himself to this degree in training. ED workup is benign. Do not think he would benefit from admission or any other workup tonight. Advised to avoid strenuous activity until follow up. He was advised to follow up with PCP and/or Cardiology for further evaluation.  ? ?ED Course  ? ?  ? ? ?MDM Rules/Calculators/A&P ?Medical Decision Making ?Given presenting complaint, I considered that admission might be necessary. After review of results from ED lab  and/or imaging studies, admission to the hospital is not indicated at this time.  ? ? ?Problems Addressed: ?Heat exhaustion, initial encounter: acute illness or injury ? ?Amount and/or Complexity of Data Reviewed ?Labs: ordered. Decision-making details documented in ED Course. ?ECG/medicine tests: ordered and independent interpretation performed. Decision-making details documented in ED Course. ? ?Risk ?Decision regarding hospitalization. ? ? ? ?Final Clinical Impression(s) / ED Diagnoses ?Final diagnoses:  ?Heat exhaustion, initial encounter  ? ? ?Rx / DC Orders ?ED Discharge Orders   ? ? None  ? ?  ? ?  ?Pollyann Savoy, MD ?08/18/21 2321 ? ?

## 2021-08-18 NOTE — ED Triage Notes (Signed)
Pt via pov from home with grogginess/feeling not well after having a heat injury on Friday. He was in [facility] running a race and passed out from heat. He was taken to hospital and was not seen. He finally left after many hours, and still feels unwell. Pt alert & oriented, nad noted.  ?

## 2022-04-12 ENCOUNTER — Emergency Department (HOSPITAL_BASED_OUTPATIENT_CLINIC_OR_DEPARTMENT_OTHER)
Admission: EM | Admit: 2022-04-12 | Discharge: 2022-04-12 | Disposition: A | Discharge status: Home / Self Care | Payer: BC Managed Care – PPO | Attending: Emergency Medicine | Admitting: Emergency Medicine

## 2022-04-12 ENCOUNTER — Encounter (HOSPITAL_BASED_OUTPATIENT_CLINIC_OR_DEPARTMENT_OTHER): Payer: Self-pay

## 2022-04-12 DIAGNOSIS — Y9362 Activity, american flag or touch football: Secondary | ICD-10-CM | POA: Insufficient documentation

## 2022-04-12 DIAGNOSIS — S86091A Other specified injury of right Achilles tendon, initial encounter: Secondary | ICD-10-CM | POA: Insufficient documentation

## 2022-04-12 DIAGNOSIS — X509XXA Other and unspecified overexertion or strenuous movements or postures, initial encounter: Secondary | ICD-10-CM | POA: Diagnosis not present

## 2022-04-12 DIAGNOSIS — S8991XA Unspecified injury of right lower leg, initial encounter: Secondary | ICD-10-CM | POA: Diagnosis present

## 2022-04-12 DIAGNOSIS — S86011A Strain of right Achilles tendon, initial encounter: Secondary | ICD-10-CM

## 2022-04-12 MED ORDER — IBUPROFEN 400 MG PO TABS
400.0000 mg | ORAL_TABLET | Freq: Once | ORAL | Status: AC
  Administered 2022-04-12: 400 mg via ORAL
  Filled 2022-04-12: qty 1

## 2022-04-12 MED ORDER — TRAMADOL HCL 50 MG PO TABS: 50.0000 mg | ORAL_TABLET | Freq: Four times a day (QID) | ORAL | 0 refills | Status: DC | PRN

## 2022-04-12 MED ORDER — TRAMADOL HCL 50 MG PO TABS
50.0000 mg | ORAL_TABLET | Freq: Once | ORAL | Status: AC
  Administered 2022-04-12: 50 mg via ORAL
  Filled 2022-04-12: qty 1

## 2022-04-12 NOTE — ED Triage Notes (Signed)
He states that, this morning while playing flag football, he felt a sudden flare of pain and heard a "pop" at right achilles' tendon area.

## 2022-04-12 NOTE — Discharge Instructions (Addendum)
It was our pleasure to provide your ER care today - we hope that you feel better.  Cold/icepack to sore area. Take acetaminophen or ibuprofen as need for pain. You may also take ultram as need for pain - no driving when taking.   Wear boot/use crutches when up/about.   Follow up with orthopedist in the next couple of days - call office today or Monday AM to arrange appointment.   Return to ER if worse, new symptoms, severe/intractable pain, or other concern.

## 2022-04-12 NOTE — ED Provider Notes (Signed)
[facility] EMERGENCY DEPT Provider Note   CSN: [id number] Arrival date & time: 04/12/22  2774     History  Chief Complaint  Patient presents with   right lower leg/achilles injury    Bradley Hammond is a 46 y.o. male.  Pt was playing flag football this AM, pushed off hard on right lower leg/foot, and felt sudden pain and popping sound/sensation in area of right achilles, and then felt as if ankle was not working normally. No hx same. Denies other pain or injury. Pain localized to right lower leg posteriorly, from ankle up towards lower calf muscle. Skin intact. No numbness/weakness. No meds pta.   The history is provided by the patient and medical records.       Home Medications Prior to Admission medications   Medication Sig Start Date End Date Taking? Authorizing Provider  ketorolac (ACULAR) 0.5 % ophthalmic solution Place 1 drop into the right eye every 6 (six) hours. 08/20/20   Nira Conn, MD  moxifloxacin (VIGAMOX) 0.5 % ophthalmic solution 2 drops every 2 hours for 2 days THEN q6hrs for 5 days 08/20/20   Cardama, Amadeo Garnet, MD      Allergies    Patient has no known allergies.    Review of Systems   Review of Systems  Constitutional:  Negative for fever.  Musculoskeletal:        Right lower leg pain/injury  Skin:  Negative for wound.  Neurological:  Negative for weakness and numbness.    Physical Exam Updated Vital Signs BP 117/64 (BP Location: Right Arm)   Pulse 78   Temp 98.1 F (36.7 C) (Oral)   Resp 20   Ht 1.829 m (6')   Wt 86.2 kg   SpO2 100%   BMI 25.77 kg/m  Physical Exam Vitals and nursing note reviewed.  Constitutional:      Appearance: Normal appearance. He is well-developed.  HENT:     Head: Atraumatic.  Eyes:     General: No scleral icterus.    Conjunctiva/sclera: Conjunctivae normal.  Neck:     Trachea: No tracheal deviation.  Cardiovascular:     Rate and Rhythm: Normal rate.     Pulses: Normal pulses.   Pulmonary:     Effort: Pulmonary effort is normal. No accessory muscle usage or respiratory distress.  Musculoskeletal:     Comments: Tenderness right lower leg posteriorly in midline from just above ankle along achilles tender toward lower gastroc m. Matles/Thompson test +. Distal pulses palp. Compartments of lower leg are soft, not tense.   Skin:    General: Skin is warm and dry.     Findings: No rash.  Neurological:     Mental Status: He is alert.     Comments: Alert, speech clear.   Psychiatric:        Mood and Affect: Mood normal.     ED Results / Procedures / Treatments   Labs (all labs ordered are listed, but only abnormal results are displayed) Labs Reviewed - No data to display  EKG None  Radiology No results found.  Procedures Procedures    Medications Ordered in ED Medications  ibuprofen (ADVIL) tablet 400 mg (has no administration in time range)  traMADol (ULTRAM) tablet 50 mg (has no administration in time range)    ED Course/ Medical Decision Making/ A&P  Medical Decision Making Problems Addressed: Rupture of right Achilles tendon, initial encounter: acute illness or injury that poses a threat to life or bodily functions  Amount and/or Complexity of Data Reviewed External Data Reviewed: notes.  Risk Prescription drug management.   Reviewed nursing notes and prior charts for additional history.   Suspect achilles tendon rupture. Icepack to sore area. No meds pta, pt has ride/does not have to drive. Ultram po, ibuprofen po. [place]. Crutches.   Rec ortho f/u in next few days.   Return precautions provided.           Final Clinical Impression(s) / ED Diagnoses Final diagnoses:  None    Rx / DC Orders ED Discharge Orders     None         Cathren Laine, MD 04/12/22 [phone]

## 2022-04-12 NOTE — ED Notes (Signed)
Dc instructions reviewed with patient. Patient voiced understanding. Dc with belongings.  °

## 2022-06-25 ENCOUNTER — Other Ambulatory Visit (HOSPITAL_COMMUNITY): Payer: Self-pay

## 2022-06-25 ENCOUNTER — Other Ambulatory Visit (HOSPITAL_COMMUNITY): Payer: Self-pay | Admitting: Orthopedic Surgery

## 2022-06-25 ENCOUNTER — Ambulatory Visit (HOSPITAL_COMMUNITY)
Admission: RE | Admit: 2022-06-25 | Discharge: 2022-06-25 | Disposition: A | Discharge status: Home / Self Care | Payer: BLUE CROSS/BLUE SHIELD | Source: Ambulatory Visit | Attending: Orthopedic Surgery | Admitting: Orthopedic Surgery

## 2022-06-25 ENCOUNTER — Ambulatory Visit (HOSPITAL_BASED_OUTPATIENT_CLINIC_OR_DEPARTMENT_OTHER)
Admission: RE | Admit: 2022-06-25 | Discharge: 2022-06-25 | Disposition: A | Discharge status: Home / Self Care | Payer: BLUE CROSS/BLUE SHIELD | Source: Ambulatory Visit | Attending: Surgery | Admitting: Surgery

## 2022-06-25 VITALS — BP 137/89 | HR 66 | Ht 72.0 in | Wt 180.0 lb

## 2022-06-25 DIAGNOSIS — I82451 Acute embolism and thrombosis of right peroneal vein: Secondary | ICD-10-CM

## 2022-06-25 DIAGNOSIS — M79604 Pain in right leg: Secondary | ICD-10-CM | POA: Diagnosis not present

## 2022-06-25 MED ORDER — APIXABAN 5 MG PO TABS: 5.0000 mg | ORAL_TABLET | Freq: Two times a day (BID) | ORAL | 1 refills | Status: DC

## 2022-06-25 MED ORDER — APIXABAN (ELIQUIS) VTE STARTER PACK (10MG AND 5MG)
ORAL_TABLET | ORAL | 0 refills | Status: DC
  Filled 2022-06-25: qty 74, 30d supply, fill #0

## 2022-06-25 NOTE — Patient Instructions (Signed)
-  Start Eliquis 10 mg (2 tablets) twice daily for 7 days, then take 5 mg (1 tablet) twice daily to complete 3 months.  -[facility] You will likely need to call the pharmacy to ask them to fill this when you start to run low on your current supply.  -It is important to take your medication around the same time every day.  -Avoid NSAIDs like ibuprofen (Advil, Motrin) and naproxen (Aleve) as well as aspirin doses over 100 mg daily. -Tylenol (acetaminophen) is the preferred over the counter pain medication to lower the risk of bleeding. -Be sure to alert all of your health care providers that you are taking an anticoagulant prior to starting a new medication or having a procedure. -Monitor for signs and symptoms of bleeding (abnormal bruising, prolonged bleeding, nose bleeds, bleeding from gums, discolored urine, black tarry stools). If you have fallen and hit your head OR if your bleeding is severe or not stopping, seek emergency care.  -Go to the emergency room if emergent signs and symptoms of new clot occur (new or worse swelling and pain in an arm or leg, shortness of breath, chest pain, fast or irregular heartbeats, lightheadedness, dizziness, fainting, coughing up blood) or if you experience a significant color change (pale or blue) in the extremity that has the DVT.  -We recommend you wear knee high compression stockings (20-30 mmHg) as long as you are having swelling or pain. Be sure to purchase the correct size and take them off at night. Measure at the smallest part of your ankle and widest part of your calf and use the size charts to find the correct size. Only wear as long as you are having swelling.   Your next visit is on Monday April 29th at [facility] [place], [place], [place] [address] Enter the hospital through Entrance C off [facility] and pull up to the [address] entrance to the free [place] parking.   Check in for your appointment at the [facility].   If you have any questions or need to reschedule an appointment, please call [phone].  If you are having an emergency, call 911 or present to the nearest emergency room.   What is a DVT?  -Deep vein thrombosis (DVT) is a condition in which a blood clot forms in a vein of the deep venous system which can occur in the lower leg, thigh, pelvis, arm, or neck. This condition is serious and can be life-threatening if the clot travels to the arteries of the lungs and causing a blockage (pulmonary embolism, PE). A DVT can also damage veins in the leg, which can lead to long-term venous disease, leg pain, swelling, discoloration, and ulcers or sores (post-thrombotic syndrome).  -Treatment may include taking an anticoagulant medication to prevent more clots from forming and the current clot from growing, wearing compression stockings, and/or surgical procedures to remove or dissolve the clot.

## 2022-06-25 NOTE — Progress Notes (Signed)
Right lower extremity venous duplex has been completed. Preliminary results can be found in CV Proc through chart review.  Results were given to Dr. Percell Miller.  06/25/22 9:55 AM Carlos Levering RVT

## 2022-06-25 NOTE — Progress Notes (Signed)
DVT Clinic Note  Name: Bradley Hammond     MRN: [id number]     DOB: 08-07-1975     Sex: male  PCP: Pcp, No  Today's Visit: Visit Information: Initial Visit  [facility]  Referred to CPP by: Dr. Trula Slade Reason for referral:  Chief Complaint  Patient presents with   DVT   HISTORY OF PRESENT ILLNESS:  Bradley Hammond is a 47 y.o. male who presents after diagnosis of R peroneal vein DVT for medication management. Patient reports he ruptured his right Achilles 04/12/22 then had surgical repair 04/16/22. Has been participating in physical therapy and has had no issues with recovery. Lives an active lifestyle. He flew to [facility] on 06/16/22 for furniture market and started noticing swelling on the 25th after this. He was on his feet a lot which worsened the swelling. He flew back on the 29th and called Dr. Debroah Loop office to be evaluated and was found to have a DVT today. Reports he was having some pain a few days ago which has now resolved. Swelling is beginning to improve as well.   Positive Thrombotic Risk Factors: Recent surgery (within 3 months), Sedentary journey lasting >8 hours within 4 weeks Bleeding Risk Factors: None Present  Negative Thrombotic Risk Factors: Previous VTE, Recent trauma (within 3 months), Recent admission to hospital with acute illness (within 3 months), Paralysis, paresis, or recent plaster cast immobilization of lower extremity, Central venous catheterization, Pregnancy, Bed rest >72 hours within 3 months, Within 6 weeks postpartum, Recent cesarean section (within 3 months), Estrogen therapy, Testosterone therapy, Active cancer, Recent COVID diagnosis (within 3 months), Erythropoiesis-stimulating agent, Non-malignant, chronic inflammatory condition, Known thrombophilic condition, Smoking, Obesity, Older age  Rx Insurance Coverage: Commercial Rx Affordability: $40 for 30 day supply. Today's fill was $0 with the one time free  savings card. Provided patient with copay card to make future fills $10.  Preferred Pharmacy: Filled starter pack at [facility] today. [facility]   Past Medical History:  Diagnosis Date   Wisdom teeth extracted    4 extracted    No past surgical history on file.  Social History   Socioeconomic History   Marital status: Married    Spouse name: Not on file   Number of children: Not on file   Years of education: Not on file   Highest education level: Not on file  Occupational History   Not on file  Tobacco Use   Smoking status: Never    Passive exposure: Never   Smokeless tobacco: Never  Vaping Use   Vaping Use: Never used  Substance and Sexual Activity   Alcohol use: Yes    Comment: 3-4 per day   Drug use: Never   Sexual activity: Yes  Other Topics Concern   Not on file  Social History Narrative   Not on file   Social Determinants of Health   Financial Resource Strain: Not on file  Food Insecurity: Not on file  Transportation Needs: Not on file  Physical Activity: Not on file  Stress: Not on file  Social Connections: Not on file  Intimate Partner Violence: Not on file    Family History  Problem Relation Age of Onset   Healthy Mother    Diabetes Father    Healthy Sister    Healthy Brother     Allergies as of 06/25/2022   (No Known  Allergies)    No current outpatient medications on file prior to encounter.   No current facility-administered medications on file prior to encounter.   REVIEW OF SYSTEMS:  Review of Systems  Respiratory:  Negative for shortness of breath.   Cardiovascular:  Positive for leg swelling. Negative for chest pain and palpitations.  Musculoskeletal:  Negative for myalgias.  Neurological:  Negative for dizziness and tingling.   PHYSICAL EXAMINATION:  Vitals:   06/25/22 1124  BP: 137/89  Pulse: 66  SpO2: 100%  Weight: 180 lb (81.6 kg)  Height: 6' (1.829 m)     Body mass index is 24.41 kg/m.  Physical Exam Vitals reviewed.  Cardiovascular:     Rate and Rhythm: Normal rate.  Pulmonary:     Effort: Pulmonary effort is normal.  Musculoskeletal:        General: Swelling (mild) present.  Psychiatric:        Mood and Affect: Mood normal.        Behavior: Behavior normal.        Thought Content: Thought content normal.   Villalta Score for Post-Thrombotic Syndrome: Pain: Mild Cramps: Absent Heaviness: Mild Paresthesia: Absent Pruritus: Absent Pretibial Edema: Mild Skin Induration: Absent Hyperpigmentation: Absent Redness: Absent Venous Ectasia: Absent Pain on calf compression: Absent Villalta Preliminary Score: 3 Is venous ulcer present?: No If venous ulcer is present and score is <15, then 15 points total are assigned: Absent Villalta Total Score: 3  LABS:  CBC     Component Value Date/Time   WBC 4.2 08/18/2021 1748   RBC 4.78 08/18/2021 1748   HGB 14.9 08/18/2021 1748   HCT 44.8 08/18/2021 1748   PLT 212 08/18/2021 1748   MCV 93.7 08/18/2021 1748   MCH 31.2 08/18/2021 1748   MCHC 33.3 08/18/2021 1748   RDW 12.7 08/18/2021 1748    Hepatic Function   No results found for: "PROT", "ALBUMIN", "AST", "ALT", "ALKPHOS", "BILITOT", "BILIDIR", "IBILI"  Renal Function   Lab Results  Component Value Date   CREATININE 1.30 (H) 08/18/2021   CREATININE 1.01 07/22/2021    CrCl cannot be calculated (Patient's most recent lab result is older than the maximum 21 days allowed.).   VVS Vascular Lab Studies:  06/25/22 VAS [place] LOWER EXTREMITY VENOUS (DVT) RIGHT Summary:  RIGHT:  - Findings consistent with acute deep vein thrombosis involving the right  peroneal veins.  - No cystic structure found in the popliteal fossa.    LEFT:  - No evidence of common femoral vein obstruction.   ASSESSMENT: Location of DVT: Right distal vein Cause of DVT: provoked by a transient risk factor - tore Achilles 04/12/22, had surgical repair 04/16/22,  flew to [facility] and back last week which preceded symptoms of RLE pain/swelling  PLAN: -Start apixaban (Eliquis) 10 mg twice daily for 7 days followed by 5 mg twice daily. -Expected duration of therapy: 3 months. Therapy started on 06/25/22. -Patient educated on purpose, proper use and potential adverse effects of apixaban (Eliquis). -Discussed importance of taking medication around the same time every day. -Advised patient of medications to avoid (NSAIDs, aspirin doses >100 mg daily). -Educated that Tylenol (acetaminophen) is the preferred analgesic to lower the risk of bleeding. -Advised patient to alert all providers of anticoagulation therapy prior to starting a new medication or having a procedure. -Emphasized importance of monitoring for signs and symptoms of bleeding (abnormal bruising, prolonged bleeding, nose bleeds, bleeding from gums, discolored urine, black tarry stools). -Educated patient to present to the ED  if emergent signs and symptoms of new thrombosis occur. -Counseled patient to wear compression stockings daily, removing at night.  Follow up: 3 months in DVT Clinic.   Rebbeca Paul, PharmD, [place], CPP Deep Vein Thrombosis Clinic Clinical Pharmacist Practitioner Office: [phone]

## 2022-09-22 ENCOUNTER — Ambulatory Visit (HOSPITAL_COMMUNITY): Payer: BLUE CROSS/BLUE SHIELD

## 2022-09-29 ENCOUNTER — Encounter (HOSPITAL_COMMUNITY): Payer: Self-pay

## 2022-09-29 ENCOUNTER — Ambulatory Visit (HOSPITAL_COMMUNITY)
Admission: RE | Admit: 2022-09-29 | Discharge: 2022-09-29 | Disposition: A | Discharge status: Home / Self Care | Payer: 59 | Source: Ambulatory Visit | Attending: Vascular Surgery | Admitting: Vascular Surgery

## 2022-09-29 VITALS — BP 138/80 | HR 70

## 2022-09-29 DIAGNOSIS — I82451 Acute embolism and thrombosis of right peroneal vein: Secondary | ICD-10-CM | POA: Insufficient documentation

## 2022-09-29 NOTE — Progress Notes (Addendum)
DVT Clinic Note  Name: Bradley Hammond     MRN: [id number]     DOB: 1975-06-14     Sex: male  PCP: Pcp, No  Today's Visit: Visit Information: Follow Up Visit  Referred to DVT Clinic by: [facility]   Referred to CPP by: Dr. Karin Lieu Reason for referral:  Chief Complaint  Patient presents with   Med Management - DVT   HISTORY OF PRESENT ILLNESS:  Bradley Hammond is a 47 y.o. male with no significant PMH who presents for follow up medication management after diagnosis of R peroneal vein DVT on 06/25/22 felt to be provoked by right Achilles rupture 04/22/22 and surgical repair 04/16/22 followed by a long plane ride on 06/16/22 after which he noticed new swelling and pain in the right leg. Last seen in DVT Clinic 06/25/22 at which time Eliquis was started.   Today patient reports his pain and swelling improved within a week of starting Eliquis and has resolved. He does notice some residual swelling in the right ankle that he feels is related to the Achilles recovery. Denies abnormal bleeding or bruising while on Eliquis. Denies missed doses of Eliquis. He completed physical therapy and has started back running and exercising more regularly without issue.    Positive Thrombotic Risk Factors: Recent surgery (within 3 months), Sedentary journey lasting >8 hours within 4 weeks Bleeding Risk Factors: None Present  Negative Thrombotic Risk Factors: Recent trauma (within 3 months), Previous VTE, Recent admission to hospital with acute illness (within 3 months), Paralysis, paresis, or recent plaster cast immobilization of lower extremity, Central venous catheterization, Pregnancy, Testosterone therapy, Estrogen therapy, Active cancer, Smoking, Obesity, Older age, Known thrombophilic condition, Recent COVID diagnosis (within 3 months), Erythropoiesis-stimulating agent, Non-malignant, chronic inflammatory condition, Within 6 weeks postpartum, Bed rest >72 hours within 3 months, Recent cesarean  section (within 3 months)  Rx Insurance Coverage: Commercial Rx Affordability: Eliquis was $40/month. Patient used copay card provided at last visit to reduce cost to $10/month.  [facility] [place]  Past Medical History:  Diagnosis Date   Wisdom teeth extracted    4 extracted    No past surgical history on file.  Social History   Socioeconomic History   Marital status: Married    Spouse name: Not on file   Number of children: Not on file   Years of education: Not on file   Highest education level: Not on file  Occupational History   Not on file  Tobacco Use   Smoking status: Never    Passive exposure: Never   Smokeless tobacco: Never  Vaping Use   Vaping Use: Never used  Substance and Sexual Activity   Alcohol use: Yes    Comment: 3-4 per day   Drug use: Never   Sexual activity: Yes  Other Topics Concern   Not on file  Social History Narrative   Not on file   Social Determinants of Health   Financial Resource Strain: Not on file  Food Insecurity: Not on file  Transportation Needs: Not on file  Physical Activity: Not on file  Stress: Not on file  Social Connections: Not on file  Intimate Partner Violence: Not on file    Family History  Problem Relation Age of Onset   Healthy Mother    Diabetes Father    Healthy Sister    Healthy Brother     Allergies as of 09/29/2022   (No Known Allergies)    No  current outpatient medications on file prior to encounter.   No current facility-administered medications on file prior to encounter.   REVIEW OF SYSTEMS:  Review of Systems  Respiratory:  Negative for shortness of breath.   Cardiovascular:  Negative for chest pain, palpitations and leg swelling.  Musculoskeletal:  Negative for myalgias.  Neurological:  Negative for dizziness and tingling.   PHYSICAL EXAMINATION:  Vitals:   09/29/22 0916  BP: 138/80  Pulse: 70  SpO2: 99%   Physical Exam Vitals reviewed.  Cardiovascular:     Rate  and Rhythm: Normal rate.  Pulmonary:     Effort: Pulmonary effort is normal.  Musculoskeletal:        General: No tenderness.     Right lower leg: No edema.  Skin:    Findings: No bruising or erythema.  Psychiatric:        Mood and Affect: Mood normal.        Behavior: Behavior normal.        Thought Content: Thought content normal.   Villalta Score for Post-Thrombotic Syndrome: Pain: Absent Cramps: Absent Heaviness: Absent Paresthesia: Absent Pruritus: Absent Pretibial Edema: Absent Skin Induration: Absent Hyperpigmentation: Absent Redness: Absent Venous Ectasia: Absent Pain on calf compression: Absent Villalta Preliminary Score: 0 Is venous ulcer present?: No If venous ulcer is present and score is <15, then 15 points total are assigned: Absent Villalta Total Score: 0  LABS:  CBC     Component Value Date/Time   WBC 4.2 08/18/2021 1748   RBC 4.78 08/18/2021 1748   HGB 14.9 08/18/2021 1748   HCT 44.8 08/18/2021 1748   PLT 212 08/18/2021 1748   MCV 93.7 08/18/2021 1748   MCH 31.2 08/18/2021 1748   MCHC 33.3 08/18/2021 1748   RDW 12.7 08/18/2021 1748    Hepatic Function   No results found for: "PROT", "ALBUMIN", "AST", "ALT", "ALKPHOS", "BILITOT", "BILIDIR", "IBILI"  Renal Function   Lab Results  Component Value Date   CREATININE 1.30 (H) 08/18/2021   CREATININE 1.01 07/22/2021    CrCl cannot be calculated (Patient's most recent lab result is older than the maximum 21 days allowed.).   VVS Vascular Lab Studies:  06/25/22 VAS [place] LOWER EXTREMITY VENOUS (DVT) RIGHT Summary:  RIGHT:  - Findings consistent with acute deep vein thrombosis involving the right  peroneal veins.  - No cystic structure found in the popliteal fossa.    LEFT:  - No evidence of common femoral vein obstruction.   ASSESSMENT: Location of DVT: Right distal vein Cause of DVT: provoked by a transient risk factor - tore Achilles 04/12/22, had surgical repair 04/16/22, flew to [facility] and  back end of January which preceded symptoms of RLE pain/swelling. He completed 3 months of anticoagulation with Eliquis without any adverse effects or toxicities and with complete resolution of symptoms.   PLAN: -Patient is discharged from the DVT Clinic. -Discontinue anticoagulation with Eliquis, as patient has completed 3 months of treatment for provoked DVT.  -Educated patient to present to the ED if emergent signs and symptoms of new thrombosis occur. -Counseled patient to inform all future providers/surgeons of history of DVT and counseled on strategies to reduce risk of future DVT.  Follow up: Encouraged patient to establish with a PCP. Reach out to DVT Clinic as needed.   Pervis Hocking, PharmD, [place], CPP Deep Vein Thrombosis Clinic Clinical Pharmacist Practitioner Office: [phone]  I have evaluated the patient's chart/imaging and refer this patient to the Clinical Pharmacist Practitioner for medication management.  I have reviewed the CPP's documentation and agree with her assessment and plan. I was immediately available during the visit for questions and collaboration.   Victorino Sparrow, MD

## 2022-09-29 NOTE — Patient Instructions (Signed)
You have been discharged from the DVT Clinic! No further follow up in the DVT Clinic is needed.  -Discontinue Eliquis -Go to the emergency room if emergent signs and symptoms of new clot occur (new or worse swelling and pain in an arm or leg, shortness of breath, chest pain, fast or irregular heartbeats, lightheadedness, dizziness, fainting, coughing up blood) or if you experience a significant color change (pale or blue) in the extremity that has the DVT.  -If you are having an emergency, call 911 or present to the nearest emergency room.   Please reach out if any questions come up. [phone] [facility].

## 2023-03-16 ENCOUNTER — Other Ambulatory Visit: Payer: Self-pay | Admitting: Family Medicine

## 2023-03-16 ENCOUNTER — Ambulatory Visit
Admission: RE | Admit: 2023-03-16 | Discharge: 2023-03-16 | Disposition: A | Discharge status: Home / Self Care | Payer: 59 | Source: Ambulatory Visit | Attending: Family Medicine | Admitting: Family Medicine

## 2023-03-16 ENCOUNTER — Encounter: Payer: Self-pay | Admitting: Family Medicine

## 2023-03-16 ENCOUNTER — Ambulatory Visit (INDEPENDENT_AMBULATORY_CARE_PROVIDER_SITE_OTHER): Payer: 59 | Admitting: Family Medicine

## 2023-03-16 ENCOUNTER — Ambulatory Visit: Payer: Self-pay

## 2023-03-16 VITALS — BP 124/84 | Ht 72.0 in | Wt 195.0 lb

## 2023-03-16 DIAGNOSIS — M25522 Pain in left elbow: Secondary | ICD-10-CM

## 2023-03-16 NOTE — Patient Instructions (Addendum)
The ultrasound shows a partial thickness distal biceps tendon tear.   I still see some fibers coming into the insertion which is good news. We will go ahead with an MRI to assess how severe this is. Icing 15 minutes at a time 3-4 times a day. I would not lift more than 5 pounds with this arm until we see the extent of the injury to the biceps tendon. Make Bradley Hammond carry his own stuff. Ibuprofen, tylenol if needed. Text me when you've had the MRI so I can ask them to read it quickly.  [facility] at [facility] [address] [place],  [place]  [address] Get Driving Directions Main: [phone]

## 2023-03-16 NOTE — Progress Notes (Signed)
PCP: Pcp, No  Subjective:   HPI: Patient is a 47 y.o. male here for left elbow pain.  Patient reports about 3 weeks ago he was lowering a ladder down onto his truck when he felt a pop anterior elbow area (eccentric motion). No bruising or swelling. Has since developed some shoulder discomfort. Had difficulty holding an ipad yesterday with left hand - discomfort, weakness feeling left elbow. No prior injuries.  Past Medical History:  Diagnosis Date   Wisdom teeth extracted    4 extracted    No current outpatient medications on file prior to visit.   No current facility-administered medications on file prior to visit.    History reviewed. No pertinent surgical history.  No Known Allergies  BP 124/84   Ht 6' (1.829 m)   Wt 195 lb (88.5 kg)   BMI 26.45 kg/m       No data to display              No data to display              Objective:  Physical Exam:  Gen: NAD, comfortable in exam room  Left elbow: No deformity. FROM with 5/5 strength.  Pain with resisted elbow flexion and supination. Minimal tenderness to palpation distal biceps tendon. NVI distally. Negative valgus and varus. Negative hook test.  Left shoulder: No swelling, ecchymoses.  No gross deformity. No TTP. FROM. Negative Hawkins, Neers. Mild positive Yergasons, speeds. Strength 5/5 with empty can and resisted internal/external rotation. NV intact distally.   Limited MSK u/s left elbow:  bicipital tendon fibers visualized at inserting on radial tuberosity.  Anechoic signal near insertion consistent with small hematoma, likely partial distal biceps tendon tear.  Assessment & Plan:  1. Left distal biceps tendon partial tear - will proceed with MRI to assess degree of tear in preparation for discussion of rehab vs. surgical referral.  Icing, tylenol, ibuprofen.  Avoid lifting more than 5 pounds with this arm.

## 2023-03-25 ENCOUNTER — Ambulatory Visit
Admission: RE | Admit: 2023-03-25 | Discharge: 2023-03-25 | Disposition: A | Discharge status: Home / Self Care | Payer: 59 | Source: Ambulatory Visit | Attending: Family Medicine | Admitting: Family Medicine

## 2023-03-25 DIAGNOSIS — M25522 Pain in left elbow: Secondary | ICD-10-CM

## 2023-04-08 ENCOUNTER — Emergency Department (HOSPITAL_BASED_OUTPATIENT_CLINIC_OR_DEPARTMENT_OTHER)
Admission: EM | Admit: 2023-04-08 | Discharge: 2023-04-09 | Disposition: A | Discharge status: Home / Self Care | Payer: No Typology Code available for payment source | Attending: Emergency Medicine | Admitting: Emergency Medicine

## 2023-04-08 ENCOUNTER — Other Ambulatory Visit: Payer: Self-pay

## 2023-04-08 ENCOUNTER — Emergency Department (HOSPITAL_BASED_OUTPATIENT_CLINIC_OR_DEPARTMENT_OTHER): Payer: No Typology Code available for payment source

## 2023-04-08 ENCOUNTER — Encounter (HOSPITAL_BASED_OUTPATIENT_CLINIC_OR_DEPARTMENT_OTHER): Payer: Self-pay | Admitting: Emergency Medicine

## 2023-04-08 DIAGNOSIS — M7989 Other specified soft tissue disorders: Secondary | ICD-10-CM | POA: Insufficient documentation

## 2023-04-08 DIAGNOSIS — Z7901 Long term (current) use of anticoagulants: Secondary | ICD-10-CM | POA: Diagnosis not present

## 2023-04-08 NOTE — ED Notes (Signed)
Patient presents to ED c/o swelling to left lower arm. Patient reports last Wednesday he had surgery on his left arm to fix a torn muscle. Patient reports he had his cast taken off yesterday and he noticed th swelling this morning. No sign of infection noted to surgical incision. Swelling noted to left lower arm near medial elbow. Pulses, sensation, and motor to LUE intact. Patient denies any recent injury since the surgery.

## 2023-04-08 NOTE — ED Provider Notes (Signed)
   [place] EMERGENCY DEPARTMENT AT [facility]  Provider Note  CSN: [id number] Arrival date & time: 04/08/23 2326  History Chief Complaint  Patient presents with   Post-op Problem    Bradley Hammond is a 47 y.o. male reports swelling in LUE after recent biceps tendon repair about a week ago. 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 He was put on Eliquis post-op and has been taking it as prescribed but developed the swelling this afternoon. Pain is well controlled. No numbness or tingling.    Home Medications Prior to Admission medications   Not on File     Allergies    Patient has no known allergies.   Review of Systems   Review of Systems Please see HPI for pertinent positives and negatives  Physical Exam BP (!) 172/101   Pulse 65   Temp 98.3 F (36.8 C) (Oral)   Resp 18   Wt 86.2 kg   SpO2 100%   BMI 25.77 kg/m   Physical Exam Vitals and nursing note reviewed.  HENT:     Head: Normocephalic.     Nose: Nose normal.  Eyes:     Extraocular Movements: Extraocular movements intact.  Cardiovascular:     Pulses: Normal pulses.  Pulmonary:     Effort: Pulmonary effort is normal.  Musculoskeletal:        General: Swelling (soft tissue swelling of L forearm and to a lesser extent biceps area. Compartments are soft) present. No tenderness. Normal range of motion.     Cervical back: Neck supple.  Skin:    Findings: No rash (on exposed skin).  Neurological:     Mental Status: He is alert and oriented to person, place, and time.  Psychiatric:        Mood and Affect: Mood normal.     ED Results / Procedures / Treatments   EKG None  Procedures Procedures  Medications Ordered in the ED Medications - No data to display  Initial Impression and Plan  Patient here with LUE swelling about a week after biceps tendon surgery. He is on Eliquis, but has history of DVT. Will send for [place] to further evaluate.   ED Course        MDM Rules/Calculators/A&P Medical Decision Making    Final Clinical Impression(s) / ED Diagnoses Final diagnoses:  None    Rx / DC Orders ED Discharge Orders     None

## 2023-04-08 NOTE — ED Triage Notes (Signed)
Patient had surgery on his left bicep a week ago and stated that it started swelling at the surgical site today.  Patient has obvious swelling around surgical site. Patient has history of similar in a different site and was told that he had a blood clot. Patient is on eliquis, hasn't missed any dosages.
